# Patient Record
Sex: Male | Born: 1995 | Race: Black or African American | Hispanic: No | Marital: Single | Smoking: Current every day smoker
Health system: Southern US, Community
[De-identification: ages and names within clinical notes are randomized; demographics above are authoritative.]

## PROBLEM LIST (undated history)

## (undated) DIAGNOSIS — J45990 Exercise induced bronchospasm: Secondary | ICD-10-CM

## (undated) DIAGNOSIS — L309 Dermatitis, unspecified: Secondary | ICD-10-CM

## (undated) DIAGNOSIS — T7840XA Allergy, unspecified, initial encounter: Secondary | ICD-10-CM

## (undated) DIAGNOSIS — M4120 Other idiopathic scoliosis, site unspecified: Secondary | ICD-10-CM

## (undated) DIAGNOSIS — L609 Nail disorder, unspecified: Secondary | ICD-10-CM

## (undated) DIAGNOSIS — F5104 Psychophysiologic insomnia: Secondary | ICD-10-CM

## (undated) DIAGNOSIS — G2561 Drug induced tics: Secondary | ICD-10-CM

## (undated) DIAGNOSIS — F909 Attention-deficit hyperactivity disorder, unspecified type: Secondary | ICD-10-CM

## (undated) HISTORY — PX: NO PAST SURGERIES: SHX2092

## (undated) HISTORY — DX: Other idiopathic scoliosis, site unspecified: M41.20

## (undated) HISTORY — DX: Psychophysiologic insomnia: F51.04

## (undated) HISTORY — DX: Nail disorder, unspecified: L60.9

## (undated) HISTORY — DX: Allergy, unspecified, initial encounter: T78.40XA

## (undated) HISTORY — DX: Attention-deficit hyperactivity disorder, unspecified type: F90.9

## (undated) HISTORY — DX: Drug induced tics: G25.61

## (undated) HISTORY — DX: Exercise induced bronchospasm: J45.990

## (undated) HISTORY — DX: Dermatitis, unspecified: L30.9

---
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
## 2019-04-29 ENCOUNTER — Ambulatory Visit (INDEPENDENT_AMBULATORY_CARE_PROVIDER_SITE_OTHER): Payer: Self-pay

## 2019-04-29 ENCOUNTER — Ambulatory Visit
Admission: EM | Admit: 2019-04-29 | Discharge: 2019-04-29 | Disposition: A | Discharge status: Home / Self Care | Payer: Self-pay | Attending: Emergency Medicine | Admitting: Emergency Medicine

## 2019-04-29 ENCOUNTER — Encounter: Payer: Self-pay | Admitting: Emergency Medicine

## 2019-04-29 ENCOUNTER — Other Ambulatory Visit: Payer: Self-pay

## 2019-04-29 DIAGNOSIS — M25562 Pain in left knee: Secondary | ICD-10-CM

## 2019-04-29 MED ORDER — MELOXICAM 15 MG PO TABS: 15.0000 mg | ORAL_TABLET | Freq: Every day | ORAL | 0 refills | Status: DC | PRN

## 2019-04-29 NOTE — ED Triage Notes (Signed)
Pt c/o left knee pain. Started 2-3 days ago. He states that he had an injury while in the [employer]. He has not tried anything.

## 2019-04-29 NOTE — ED Provider Notes (Signed)
[facility] ____________________________________________  Time seen: Approximately 12:01 PM  I have reviewed the triage vital signs and the nursing notes.   HISTORY  Chief Complaint Knee Pain (left)  HPI Miguel Vargas is a 24 y.o. male presenting for evaluation of left knee pain.  Patient reports left knee pain has been present for the last 2 to 3 days.  Reports this is an intermittent issue for him that flares up every so often.  Reports he was in the [employer] and had knee issues then, but no specific injury.  Denies any recent fall, direct injury or direct trauma.  Reports overall has had intermittent knee issues since middle school.  Has not been taking any over-the-counter medication for the same complaints.  States pain is mostly with walking and direct weightbearing.  Occasionally feels like his knee is going to give out.  Denies any clicking or popping.  Denies paresthesias, pain radiation, swelling, redness or break in skin.  Reports otherwise doing well.    Past Medical History:  Diagnosis Date  . Allergy   . 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 Chronic insomnia   . Drug-induced tics   . Eczema   . Exercise-induced asthma   . Nail abnormalities   . Scoliosis (and kyphoscoliosis), idiopathic     Patient Active Problem List   Diagnosis Date Noted  . Drug-induced tics 02/26/2015  . ADD (attention deficit disorder) 12/24/2014  . Allergic rhinitis 12/24/2014  . Insomnia, persistent 12/24/2014  . Dermatitis, eczematoid 12/24/2014  . Asthma, exercise induced 12/24/2014  . Scoliosis (and kyphoscoliosis), idiopathic 01/22/2010    Past Surgical History:  Procedure Laterality Date  . NO PAST SURGERIES       No current facility-administered medications for this encounter.  Current Outpatient Medications:  .  meloxicam (MOBIC) 15 MG tablet, Take 1 tablet (15 mg total) by mouth daily as needed., Disp: 10 tablet, Rfl:  0  Allergies Patient has no known allergies.  Family History  Problem Relation Age of Onset  . Diabetes Father   . Pancreatitis Father   . Allergies Brother     Social History Social History   Tobacco Use  . Smoking status: Never Smoker  . Smokeless tobacco: Never Used  Substance Use Topics  . Alcohol use: Yes    Alcohol/week: 0.0 standard drinks  . Drug use: No    Review of Systems Constitutional: No fever ENT: No sore throat. Cardiovascular: Denies chest pain. Respiratory: Denies shortness of breath. Gastrointestinal: No abdominal pain. Musculoskeletal: Positive left knee pain. Skin: Negative for rash. Neurological: Negative for focal weakness or numbness.   ____________________________________________   PHYSICAL EXAM:  VITAL SIGNS: ED Triage Vitals  Enc Vitals Group     BP 04/29/19 1110 113/70     Pulse Rate 04/29/19 1110 (!) 56     Resp 04/29/19 1110 18     Temp 04/29/19 1110 97.8 F (36.6 C)     Temp Source 04/29/19 1110 Oral     SpO2 04/29/19 1110 100 %     Weight 04/29/19 1107 135 lb (61.2 kg)     Height 04/29/19 1107 6' (1.829 m)     Head Circumference --      Peak Flow --      Pain Score 04/29/19 1107 2     Pain Loc --      Pain Edu? --      Excl. in GC? --     Constitutional: Alert  and oriented. Well appearing and in no acute distress. Eyes: Conjunctivae are normal. ENT      Head: Normocephalic and atraumatic. Cardiovascular: Normal rate, regular rhythm. Grossly normal heart sounds.  Good peripheral circulation. Respiratory: Normal respiratory effort without tachypnea nor retractions. Breath sounds are clear and equal bilaterally. No wheezes, rales, rhonchi. Musculoskeletal: Steady gait. Bilateral pedal pulses equal and easily palpated. Except: mild anterior left inferior patella tenderness to direct palpation, no swelling, no ecchymosis, no point bony tenderness, able to fully extend and flex knee, minimal pain with posterior drawer test,  no pain with anterior drawer test, no pain with medial or lateral stress.  Left lower extremity otherwise nontender. Neurologic:  Normal speech and language.  Skin:  Skin is warm, dry and intact. No rash noted. Psychiatric: Mood and affect are normal. Speech and behavior are normal. Patient exhibits appropriate insight and judgment   ___________________________________________   LABS (all labs ordered are listed, but only abnormal results are displayed)  Labs Reviewed - No data to display ____________________________________________  RADIOLOGY  DG Knee Complete 4 Views Left  Result Date: 04/29/2019 CLINICAL DATA:  Left knee pain no injury. EXAM: LEFT KNEE - COMPLETE 4+ VIEW COMPARISON:  None. FINDINGS: No joint effusion or fracture. No degenerative changes. There is a well corticated fragment at the tibial tuberosity. IMPRESSION: 1. No acute or degenerative findings. 2. Well corticated fragment at the tibial tuberosity can be seen with healed Osgood-Schlatter disease. Electronically Signed   By: Lorin Picket M.D.   On: 04/29/2019 11:37   ____________________________________________   PROCEDURES Procedures    INITIAL IMPRESSION / ASSESSMENT AND PLAN / ED COURSE  Pertinent labs & imaging results that were available during my care of the patient were reviewed by me and considered in my medical decision making (see chart for details).  Well-appearing patient.  No acute distress.  Left knee x-ray as above per radiologist, no acute or degenerative findings, well-corticated fragment at tibial tuberosity can be seen with healed Osgood Schlatter disease.  Discussed these findings with patient including likely his middle school knee pain Osgood-Schlatter.  Encourage supportive care, knee sleeve as needed.  Will treat with Mobic.  Ice, heat, stretching.  Encourage patient to follow-up with orthopedic for continued knee pain.  Supportive care.Discussed indication, risks and benefits of  medications with patient.   Discussed follow up and return parameters including no resolution or any worsening concerns. Patient verbalized understanding and agreed to plan.   ____________________________________________   FINAL CLINICAL IMPRESSION(S) / ED DIAGNOSES  Final diagnoses:  Acute pain of left knee     ED Discharge Orders         Ordered    meloxicam (MOBIC) 15 MG tablet  Daily PRN     04/29/19 1152           Note: This dictation was prepared with Dragon dictation along with smaller phrase technology. Any transcriptional errors that result from this process are unintentional.         Marylene Land, NP 04/29/19 1209

## 2019-04-29 NOTE — Discharge Instructions (Addendum)
Take medication as prescribed. Rest. Drink plenty of fluids. Ice. Stretch.   Follow up with orthopedic as discussed.   Follow up with your primary care physician this week as needed. Return to Urgent care for new or worsening concerns.

## 2019-05-08 ENCOUNTER — Other Ambulatory Visit: Payer: Self-pay

## 2019-10-11 ENCOUNTER — Ambulatory Visit (INDEPENDENT_AMBULATORY_CARE_PROVIDER_SITE_OTHER): Payer: BC Managed Care – PPO | Admitting: Family Medicine

## 2019-10-11 ENCOUNTER — Encounter: Payer: Self-pay | Admitting: Family Medicine

## 2019-10-11 ENCOUNTER — Other Ambulatory Visit: Payer: Self-pay

## 2019-10-11 VITALS — BP 130/70 | HR 88 | Ht 72.0 in | Wt 163.0 lb

## 2019-10-11 DIAGNOSIS — Z7689 Persons encountering health services in other specified circumstances: Secondary | ICD-10-CM | POA: Diagnosis not present

## 2019-10-11 NOTE — Progress Notes (Signed)
Date:  10/11/2019   Name:  Miguel Vargas   DOB:  08-May-1995   MRN:  [id number]   Chief Complaint: Establish Care (just needed pcp)  Patient is a 24 year old male who presents for an establish care exam. The patient reports the following problems: none. Health maintenance has been reviewed up to date.   No results found for: CREATININE, BUN, NA, K, CL, CO2 No results found for: CHOL, HDL, LDLCALC, LDLDIRECT, TRIG, CHOLHDL No results found for: TSH No results found for: HGBA1C No results found for: WBC, HGB, HCT, MCV, PLT No results found for: ALT, AST, GGT, ALKPHOS, BILITOT   Review of Systems  Constitutional: Negative for chills and fever.  HENT: Negative for drooling, ear discharge, ear pain, nosebleeds, postnasal drip, rhinorrhea and sore throat.   Respiratory: Negative for cough, shortness of breath and wheezing.   Cardiovascular: Negative for chest pain, palpitations and leg swelling.  Gastrointestinal: Negative for abdominal pain, blood in stool, constipation, diarrhea and nausea.  Endocrine: Negative for polydipsia.  Genitourinary: Negative for dysuria, frequency, hematuria and urgency.  Musculoskeletal: Negative for back pain, myalgias and neck pain.  Skin: Negative for rash.  Allergic/Immunologic: Negative for environmental allergies.  Neurological: Negative for dizziness and headaches.  Hematological: Does not bruise/bleed easily.  Psychiatric/Behavioral: Negative for suicidal ideas. The patient is not nervous/anxious.     Patient Active Problem List   Diagnosis Date Noted   Drug-induced tics 02/26/2015   ADD (attention deficit disorder) 12/24/2014   Allergic rhinitis 12/24/2014   Insomnia, persistent 12/24/2014   Dermatitis, eczematoid 12/24/2014   Asthma, exercise induced 12/24/2014   Scoliosis (and kyphoscoliosis), idiopathic 01/22/2010    No Known Allergies  Past Surgical History:  Procedure Laterality Date   NO PAST SURGERIES        Social History   Tobacco Use   Smoking status: Current Every Day Smoker    Types: E-cigarettes   Smokeless tobacco: Never Used  Vaping Use   Vaping Use: Every day  Substance Use Topics   Alcohol use: Yes    Alcohol/week: 0.0 standard drinks    Comment: socially   Drug use: No     Medication list has been reviewed and updated.  No outpatient medications have been marked as taking for the 10/11/19 encounter (Office Visit) with Duanne Limerick, MD.    [facility] 2/9 Scores 10/11/2019 08/12/2015 06/24/2015 02/26/2015  PHQ - 2 Score 0 0 0 0  PHQ- 9 Score 0 - - -    GAD 7 : Generalized Anxiety Score 10/11/2019  Nervous, Anxious, on Edge 0  Control/stop worrying 0  Worry too much - different things 0  Trouble relaxing 0  Restless 0  Easily annoyed or irritable 0  Afraid - awful might happen 0  Total GAD 7 Score 0    BP Readings from Last 3 Encounters:  10/11/19 130/70  04/29/19 113/70  08/12/15 124/68 (69 %, Z = 0.49 /  44 %, Z = -0.15)*   *BP percentiles are based on the 2017 AAP Clinical Practice Guideline for boys    Physical Exam Vitals and nursing note reviewed.  HENT:     Head: Normocephalic.     Right Ear: Tympanic membrane, ear canal and external ear normal. There is no impacted cerumen.     Left Ear: Tympanic membrane, ear canal and external ear normal. There is no impacted cerumen.     Nose: Nose normal. No congestion or rhinorrhea.  Mouth/Throat:     Mouth: Mucous membranes are moist.  Eyes:     General: No scleral icterus.       Right eye: No discharge.        Left eye: No discharge.     Conjunctiva/sclera: Conjunctivae normal.     Pupils: Pupils are equal, round, and reactive to light.  Neck:     Thyroid: No thyromegaly.     Vascular: No JVD.     Trachea: No tracheal deviation.  Cardiovascular:     Rate and Rhythm: Normal rate and regular rhythm.     Heart sounds: Normal heart sounds. No murmur heard.  No friction rub. No gallop.   Pulmonary:      Effort: No respiratory distress.     Breath sounds: Normal breath sounds. No wheezing, rhonchi or rales.  Abdominal:     General: Bowel sounds are normal.     Palpations: Abdomen is soft. There is no mass.     Tenderness: There is no abdominal tenderness. There is no guarding or rebound.  Musculoskeletal:        General: No tenderness. Normal range of motion.     Cervical back: Normal range of motion and neck supple.  Lymphadenopathy:     Cervical: No cervical adenopathy.  Skin:    General: Skin is warm.     Capillary Refill: Capillary refill takes less than 2 seconds.     Coloration: Skin is not pale.     Findings: No rash.  Neurological:     Mental Status: He is alert and oriented to person, place, and time.     Cranial Nerves: No cranial nerve deficit.     Deep Tendon Reflexes: Reflexes are normal and symmetric.     Wt Readings from Last 3 Encounters:  10/11/19 163 lb (73.9 kg)  04/29/19 135 lb (61.2 kg)  08/12/15 143 lb 11.2 oz (65.2 kg) (44 %, Z= -0.15)*   * Growth percentiles are based on CDC (Boys, 2-20 Years) data.    BP 130/70    Pulse 88    Ht 6' (1.829 m)    Wt 163 lb (73.9 kg)    BMI 22.11 kg/m   Assessment and Plan: 1. Establishing care with new doctor, encounter for Establishment with new physician by patient with no current medical concerns.  Patient's previous encounters were reviewed as well as most recent labs most recent imaging and care everywhere reviewed as well.  Currently patient is not needing any medications and we will see on a as needed basis.

## 2019-10-17 ENCOUNTER — Ambulatory Visit: Payer: Self-pay | Admitting: Family Medicine

## 2021-02-15 IMAGING — CR DG KNEE COMPLETE 4+V*L*
4 series · 4 of 4 positions shown · non-contrast
Comparison: None.

CLINICAL DATA: Left knee pain no injury.

EXAM:
LEFT KNEE - COMPLETE 4+ VIEW

[knee ap]
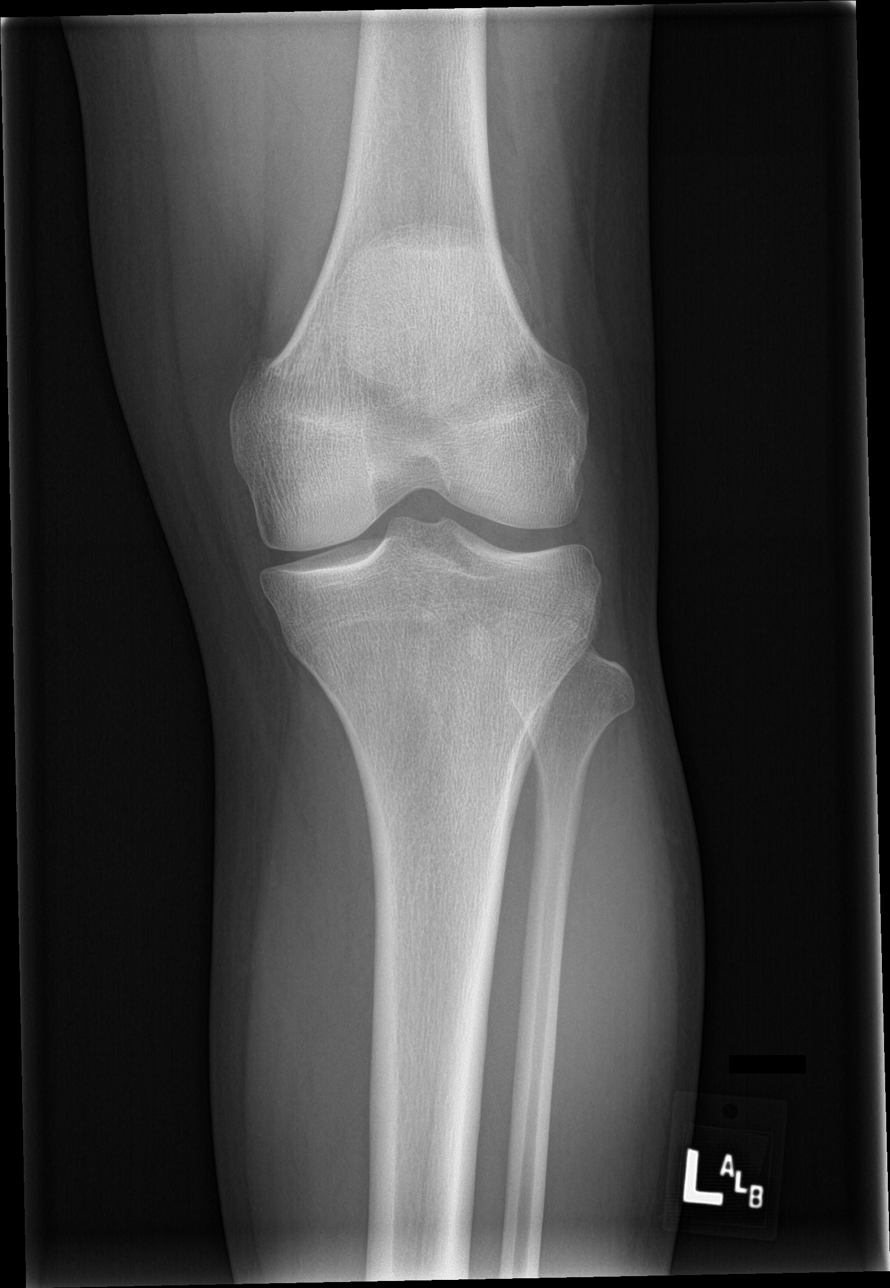

[knee lat]
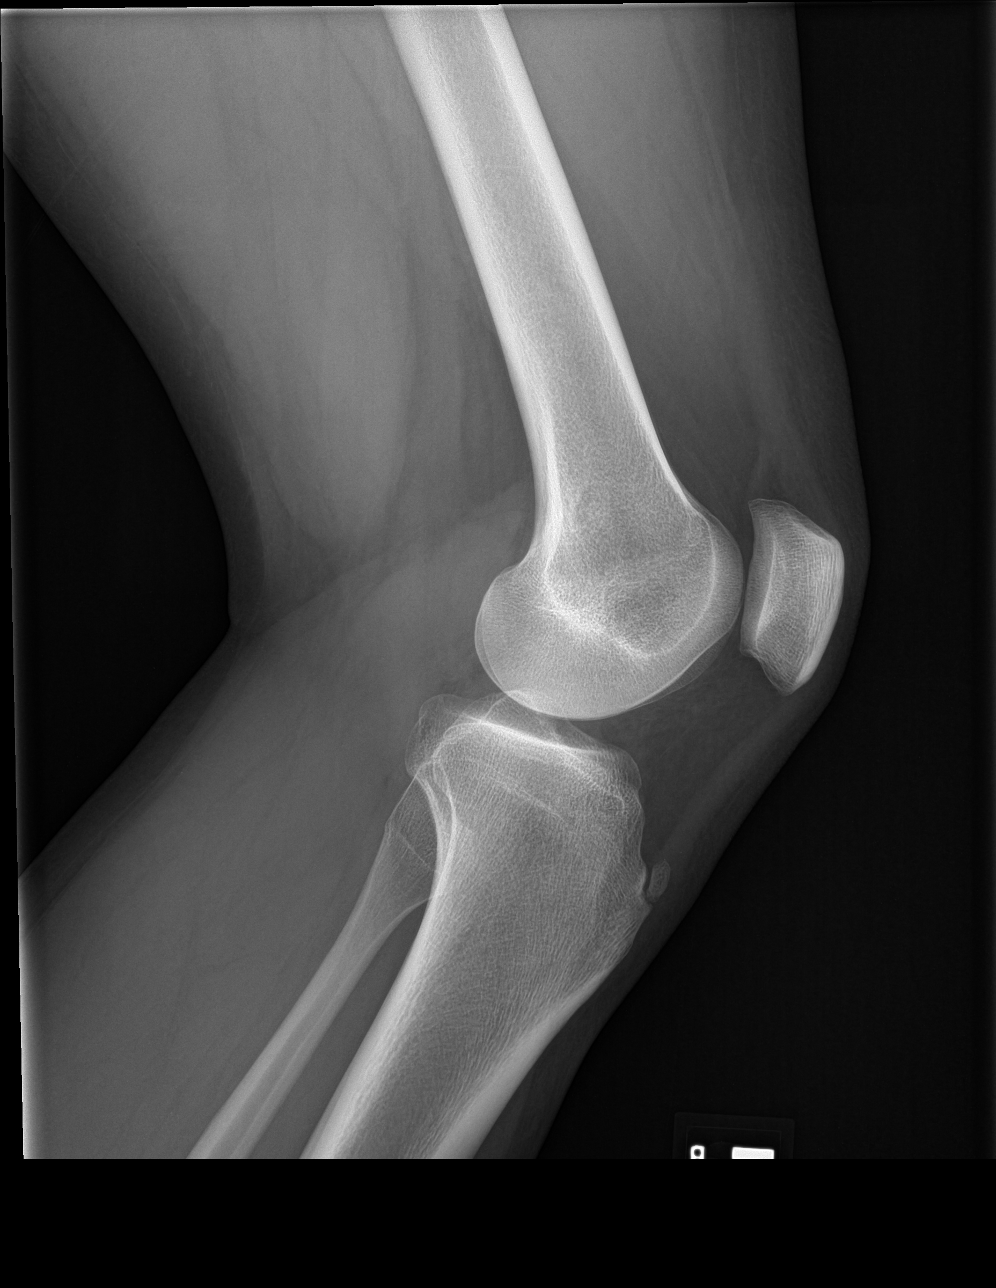

[tunnel]
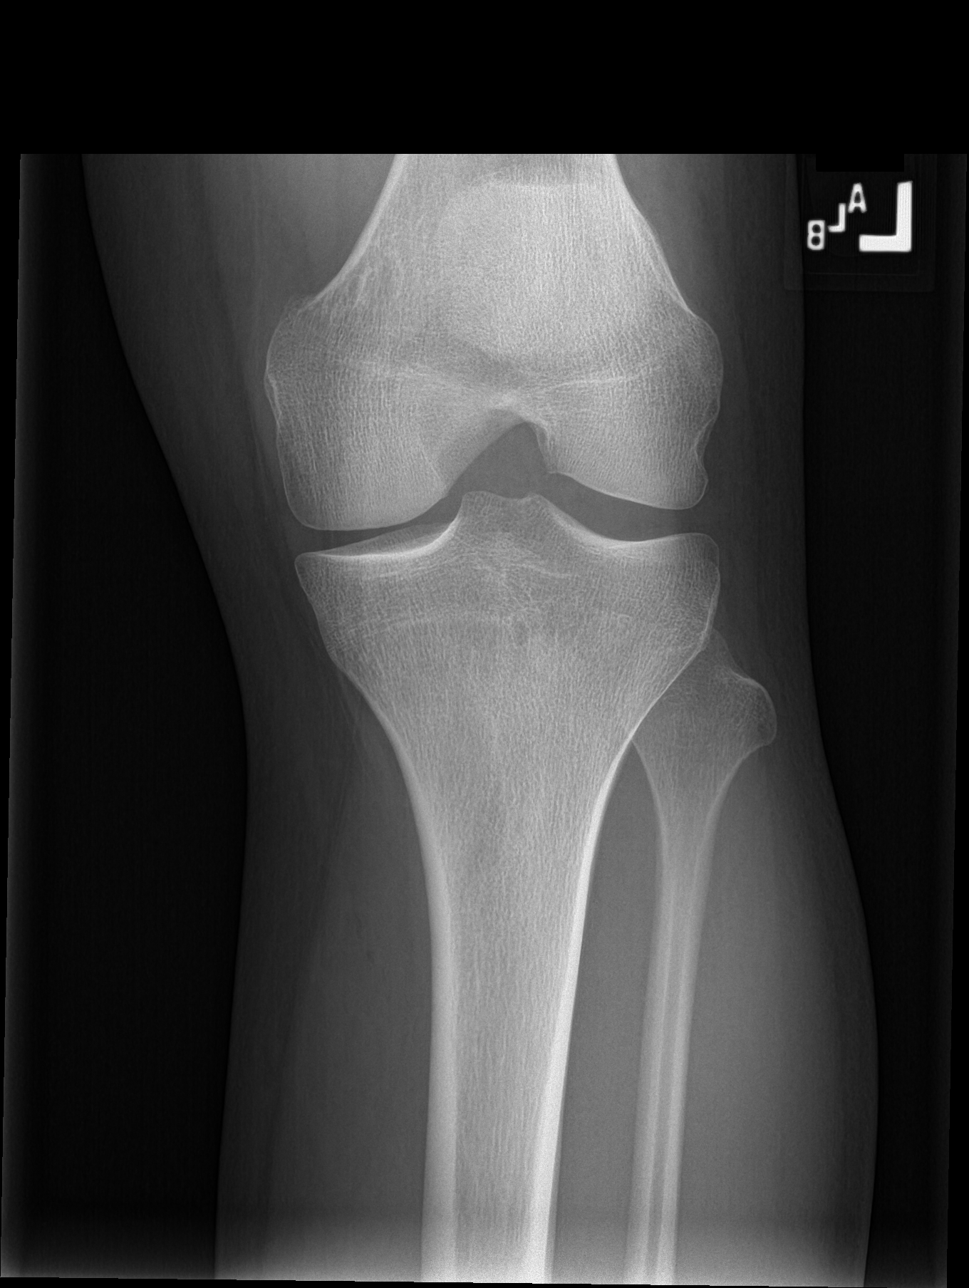

[patella skyline]
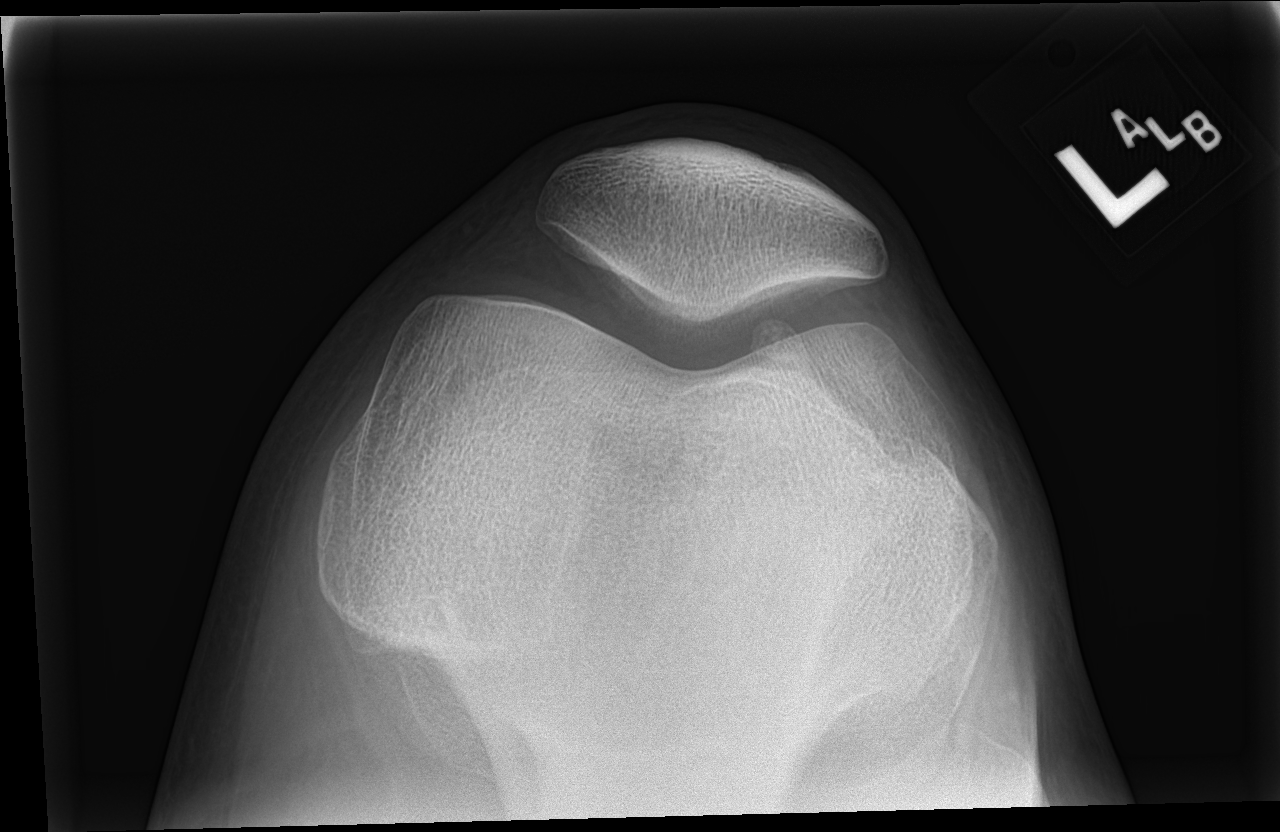

[4 of 4 positions shown; findings below may reference images not displayed]

FINDINGS: No joint effusion or fracture. No degenerative changes. There is a
well corticated fragment at the tibial tuberosity.
IMPRESSION: 1. No acute or degenerative findings.
2. Well corticated fragment at the tibial tuberosity can be seen
with healed Osgood-Schlatter disease.

## 2023-08-19 ENCOUNTER — Other Ambulatory Visit: Payer: Self-pay

## 2023-08-19 ENCOUNTER — Emergency Department (HOSPITAL_BASED_OUTPATIENT_CLINIC_OR_DEPARTMENT_OTHER)
Admission: EM | Admit: 2023-08-19 | Discharge: 2023-08-19 | Discharge status: Absent without leave | Payer: Self-pay | Source: Home / Self Care
